# Patient Record
Sex: Female | Born: 1960 | Race: White | Hispanic: No | Marital: Married | Smoking: Never smoker
Health system: Southern US, Community
[De-identification: ages and names within clinical notes are randomized; demographics above are authoritative.]

## PROBLEM LIST (undated history)

## (undated) DIAGNOSIS — M797 Fibromyalgia: Secondary | ICD-10-CM

## (undated) DIAGNOSIS — Z91018 Allergy to other foods: Secondary | ICD-10-CM

## (undated) DIAGNOSIS — R7989 Other specified abnormal findings of blood chemistry: Secondary | ICD-10-CM

## (undated) DIAGNOSIS — Z91030 Bee allergy status: Secondary | ICD-10-CM

## (undated) DIAGNOSIS — Z91038 Other insect allergy status: Secondary | ICD-10-CM

## (undated) DIAGNOSIS — K900 Celiac disease: Secondary | ICD-10-CM

## (undated) HISTORY — DX: Allergy to other foods: Z91.018

## (undated) HISTORY — DX: Celiac disease: K90.0

## (undated) HISTORY — DX: Fibromyalgia: M79.7

## (undated) HISTORY — DX: Other insect allergy status: Z91.038

## (undated) HISTORY — DX: Other specified abnormal findings of blood chemistry: R79.89

---
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
## 1998-08-12 ENCOUNTER — Other Ambulatory Visit: Admission: RE | Admit: 1998-08-12 | Discharge: 1998-08-12 | Payer: Self-pay | Admitting: Obstetrics and Gynecology

## 1999-08-30 ENCOUNTER — Other Ambulatory Visit: Admission: RE | Admit: 1999-08-30 | Discharge: 1999-08-30 | Payer: Self-pay | Admitting: Family Medicine

## 2000-09-04 ENCOUNTER — Other Ambulatory Visit: Admission: RE | Admit: 2000-09-04 | Discharge: 2000-09-04 | Payer: Self-pay | Admitting: Obstetrics and Gynecology

## 2001-12-11 ENCOUNTER — Other Ambulatory Visit: Admission: RE | Admit: 2001-12-11 | Discharge: 2001-12-11 | Payer: Self-pay | Admitting: Obstetrics and Gynecology

## 2003-03-03 ENCOUNTER — Other Ambulatory Visit: Admission: RE | Admit: 2003-03-03 | Discharge: 2003-03-03 | Payer: Self-pay | Admitting: Obstetrics and Gynecology

## 2004-03-09 ENCOUNTER — Other Ambulatory Visit: Admission: RE | Admit: 2004-03-09 | Discharge: 2004-03-09 | Payer: Self-pay | Admitting: Obstetrics and Gynecology

## 2005-05-26 ENCOUNTER — Other Ambulatory Visit: Admission: RE | Admit: 2005-05-26 | Discharge: 2005-05-26 | Payer: Self-pay | Admitting: Obstetrics and Gynecology

## 2005-07-10 HISTORY — PX: CHOLECYSTECTOMY: SHX55

## 2017-03-29 ENCOUNTER — Encounter: Payer: Self-pay | Admitting: Allergy and Immunology

## 2017-03-29 ENCOUNTER — Ambulatory Visit (INDEPENDENT_AMBULATORY_CARE_PROVIDER_SITE_OTHER): Payer: BLUE CROSS/BLUE SHIELD | Admitting: Allergy and Immunology

## 2017-03-29 VITALS — BP 104/72 | HR 76 | Temp 98.2°F | Resp 16 | Ht 65.7 in | Wt 134.7 lb

## 2017-03-29 DIAGNOSIS — T781XXA Other adverse food reactions, not elsewhere classified, initial encounter: Secondary | ICD-10-CM | POA: Diagnosis not present

## 2017-03-29 DIAGNOSIS — IMO0001 Reserved for inherently not codable concepts without codable children: Secondary | ICD-10-CM

## 2017-03-29 DIAGNOSIS — J3089 Other allergic rhinitis: Secondary | ICD-10-CM | POA: Diagnosis not present

## 2017-03-29 DIAGNOSIS — T782XXA Anaphylactic shock, unspecified, initial encounter: Secondary | ICD-10-CM | POA: Diagnosis not present

## 2017-03-29 DIAGNOSIS — T63441A Toxic effect of venom of bees, accidental (unintentional), initial encounter: Secondary | ICD-10-CM | POA: Diagnosis not present

## 2017-03-29 MED ORDER — AUVI-Q 0.3 MG/0.3ML IJ SOAJ
INTRAMUSCULAR | 3 refills | Status: DC
Start: 2017-03-29 — End: 2018-04-25

## 2017-03-29 NOTE — Progress Notes (Signed)
Dear Dr. Unk Lightning,  Thank you for referring [name] to the [facility] on 03/29/2017.   Below is a summation of this patient's evaluation and recommendations.  Thank you for your referral. I will keep you informed about this patient's response to treatment.   If you have any questions please do not hesitate to contact me.   Sincerely,  Jiles Prows, MD Allergy / Immunology [facility]   ______________________________________________________________________    NEW PATIENT NOTE  Referring Provider: Myrlene Broker, MD Primary Provider: Myrlene Broker, MD Date of office visit: 03/29/2017    Subjective:   Chief Complaint:  Heather Barry (DOB: [birth year]) is a 56 y.o. female who presents to the clinic on 03/29/2017 with a chief complaint of Allergic Reaction .     HPI: [name] presents to this clinic in evaluation of 2 allergic reactions that occurred since the summer of 2018.  Apparently in July 2018 she was stung by some un-identified insect on her left thigh for which she applied a Benadryl cream only to have this reaction develop a large local reaction that went down past her knee along with shortness of breath for which she was evaluated by her primary care doctor. Apparently blood tests at that point in time identified peanut and shrimp allergy and she has been peanut and shrimp free since that event although it should be noted that she ate peanut and shrimp the week or 2 before with no problem. 3 weeks ago she was stung on the left leg and had a small whelp, and immediately took oral Benadryl and developed a very large local reaction within 1 hour without any associated systemic or constitutional symptoms.  In addition to this most recent allergic reaction she also has a history of having an allergy to [place] nut and walnuts and almonds. Apparently all of these nuts at the  age of 47 gave rise to throat swelling. She has been [place] nut and walnut and almond free since.  She does have a history of sneezing and nasal congestion and an itchy throat occurring on a perennial basis with flare during the spring and fall especially following exposure to grasses and cats. Apparently she gets treated with a Kenalog injection one or 2 times per year for this issue. In addition, when she eats melon or bananas she develops a very itchy throat.  Past Medical History:  Diagnosis Date  . Fibromyalgia   . Low vitamin D level     Past Surgical History:  Procedure Laterality Date  . CESAREAN SECTION  11/1996  . CHOLECYSTECTOMY  2007    Allergies as of 03/29/2017      Reactions   Cefdinir Other (See Comments)   Stomach pain   Clarithromycin Other (See Comments)   Stomach pain   Zoloft  [sertraline Hcl] Itching      Medication List      BENEFIBER PO Take by mouth daily.   clonazePAM 0.25 MG disintegrating tablet Commonly known as:  KLONOPIN DISSOLVE ONE TABLET BY MOUTH EVERY TWELVE HOURS AS NEEDED   EPIPEN 2-PAK 0.3 mg/0.3 mL Soaj injection Generic drug:  EPINEPHrine USE as needed FOR SEVERE allergic reaction   PRESCRIPTION MEDICATION 2 (two) times daily. BioIdentical Hormone   ranitidine 150 MG tablet Commonly known as:  ZANTAC Take 150 mg by mouth 2 (two) times daily.   Vitamin D3 50000 units Caps Take 1  capsule by mouth once a week.       Review of systems negative except as noted in HPI / PMHx or noted below:  Review of Systems  Constitutional: Negative.   HENT: Negative.   Eyes: Negative.   Respiratory: Negative.   Cardiovascular: Negative.   Gastrointestinal: Negative.   Genitourinary: Negative.   Musculoskeletal: Negative.   Skin: Negative.   Neurological: Negative.   Endo/Heme/Allergies: Negative.   Psychiatric/Behavioral: Negative.     Family History  Problem Relation Age of Onset  . Stroke Mother   . High blood pressure Mother    . High Cholesterol Mother   . Thyroid disease Father   . Stroke Father   . High Cholesterol Father   . Multiple sclerosis Sister   . Food Allergy Sister        Peanut Allergy    Social History   Social History  . Marital status: Married    Spouse name: N/A  . Number of children: N/A  . Years of education: N/A   Occupational History  . Not on file.   Social History Main Topics  . Smoking status: Never Smoker  . Smokeless tobacco: Never Used  . Alcohol use No  . Drug use: No  . Sexual activity: Not on file   Other Topics Concern  . Not on file   Social History Narrative  . No narrative on file    Environmental and Social history  Lives in a house with a dry environment, a dog located inside the household, carpeting the bedroom, plastic on the bed, no plastic on the pillow, and no smokers located inside the household.  Objective:   Vitals:   03/29/17 1022  BP: 104/72  Pulse: 76  Resp: 16  Temp: 98.2 F (36.8 C)   Height: 5' 5.7" (166.9 cm) Weight: 134 lb 11.2 oz (61.1 kg)  Physical Exam  Constitutional: She is well-developed, well-nourished, and in no distress.  HENT:  Head: Normocephalic. Head is without right periorbital erythema and without left periorbital erythema.  Right Ear: Tympanic membrane, external ear and ear canal normal.  Left Ear: Tympanic membrane, external ear and ear canal normal.  Nose: Nose normal. No mucosal edema or rhinorrhea.  Mouth/Throat: Uvula is midline, oropharynx is clear and moist and mucous membranes are normal. No oropharyngeal exudate.  Eyes: Pupils are equal, round, and reactive to light. Conjunctivae and lids are normal.  Neck: Trachea normal. No tracheal tenderness present. No tracheal deviation present. No thyromegaly present.  Cardiovascular: Normal rate, regular rhythm, S1 normal, S2 normal and normal heart sounds.   No murmur heard. Pulmonary/Chest: Effort normal and breath sounds normal. No stridor. No tachypnea.  No respiratory distress. She has no wheezes. She has no rales. She exhibits no tenderness.  Abdominal: Soft. She exhibits no distension and no mass. There is no hepatosplenomegaly. There is no tenderness. There is no rebound and no guarding.  Musculoskeletal: She exhibits no edema or tenderness.  Lymphadenopathy:       Head (right side): No tonsillar adenopathy present.       Head (left side): No tonsillar adenopathy present.    She has no cervical adenopathy.    She has no axillary adenopathy.  Neurological: She is alert. Gait normal.  Skin: No rash noted. She is not diaphoretic. No erythema. No pallor. Nails show no clubbing.  Psychiatric: Mood and affect normal.    Diagnostics: Allergy skin tests were performed. She did not demonstrate any hypersensitivity to a screening  panel of foods. She did demonstrate hypersensitivity against fire ant.  Results of blood tests obtained 01/11/2017 identified a peanut IgE at 0.14 KU/ML and shrimp at 0.21 KU/ML. She had multiple increased titers of IgE antibodies directed against house dust mite, cat, grasses, trees, and weeds with a total IgE level of 111 KU/L  Assessment and Plan:    1. Anaphylaxis, initial encounter   2. Hymenoptera reaction, accidental or unintentional, initial encounter   3. Other allergic rhinitis   4. Pollen-food allergy, initial encounter     1. Allergen avoidance measures  2. Auvi-Q 0.3, Benadryl, M.D./ER evaluation for allergic reaction  3. Blood - fire ant IgE, Hymenoptera venom panel, shellfish panel, nut panel  4. Can use OTC Nasacort/Rhinocort one spray each nostril one time per day to prevent upper airway allergic symptoms  5. Can use OTC Claritin/Zyrtec/Allegra one time per day as needed  6. Obtain fall flu vaccine  7. Further evaluation treatment? Yes if recurrent.   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 She will follow-up this atopic analysis by checking for antigen specific antibodies directed against fire ant as well as other Hymenoptera as well as further defining her sensitivity to all shellfish members and nut members. In addition, she can use nasal steroids and antihistamines for her upper airway atopic disease. I will contact her with the results of her blood tests once they are available for review. If they do confirm fire ant allergy we will be starting her on a course of fire ant immunotherapy and we will make a determination about further evaluation for shellfish and nut allergy pending the results of those tests.  Jiles Prows, MD Allergy / Immunology [facility]

## 2017-03-29 NOTE — Patient Instructions (Addendum)
  1. Allergen avoidance measures  2. Auvi-Q 0.3, Benadryl, M.D./ER evaluation for allergic reaction  3. Blood - fire ant IgE, Hymenoptera venom panel, shellfish panel, nut panel  4. Can use OTC Nasacort/Rhinocort one spray each nostril one time per day to prevent upper airway allergic symptoms  5. Can use OTC Claritin/Zyrtec/Allegra one time per day as needed  6. Obtain fall flu vaccine  7. Further evaluation treatment? Yes if recurrent.

## 2017-04-02 LAB — ALLERGEN FIRE ANT: I070-IgE Fire Ant (Invicta): 0.59 kU/L — AB

## 2017-04-02 LAB — ALLERGENS(7)
F020-IgE Almond: <0.1 kU/L
F202-IgE Cashew Nut: <0.1 kU/L
Hazelnut (Filbert) IgE: <0.1 kU/L
Walnut IgE: <0.1 kU/L

## 2017-04-02 LAB — ALLERGEN PROFILE, SHELLFISH
F023-IgE Crab: 0.12 kU/L — AB
F080-IgE Lobster: <0.1 kU/L
F290-IgE Oyster: <0.1 kU/L
Shrimp IgE: 0.12 kU/L — AB

## 2017-04-02 LAB — ALLERGEN HYMENOPTERA PANEL
Honeybee IgE: <0.1 kU/L
Hornet, White Face, IgE: 1.16 kU/L — AB
Hornet, Yellow, IgE: 0.23 kU/L — AB
I003-IGE YELLOW JACKET: 3.43 kU/L — AB
Paper Wasp IgE: 3.4 kU/L — AB

## 2017-04-09 ENCOUNTER — Ambulatory Visit: Payer: BLUE CROSS/BLUE SHIELD | Admitting: Allergy and Immunology

## 2017-04-09 VITALS — BP 110/60 | HR 80 | Resp 14 | Ht 66.0 in | Wt 141.0 lb

## 2017-04-09 DIAGNOSIS — T63441D Toxic effect of venom of bees, accidental (unintentional), subsequent encounter: Secondary | ICD-10-CM

## 2017-04-09 DIAGNOSIS — IMO0001 Reserved for inherently not codable concepts without codable children: Secondary | ICD-10-CM

## 2017-04-09 NOTE — Progress Notes (Signed)
Heather Barry presents to this clinic to discuss her blood test results which identified IgE antibodies directed against shellfish members including shrimp and crab and IgE antibodies directed against mixed vespid including hornet and yellow jacket and wasp and fire ant. She will be starting immunotherapy against mixed vespid and wasp and fire ant. I will see her back in this clinic in 6 months. She has in her possession a epinephrine autoinjector device.

## 2017-04-10 ENCOUNTER — Encounter: Payer: Self-pay | Admitting: Allergy and Immunology

## 2017-04-19 ENCOUNTER — Other Ambulatory Visit: Payer: Self-pay | Admitting: Allergy and Immunology

## 2017-04-19 DIAGNOSIS — T63481D Toxic effect of venom of other arthropod, accidental (unintentional), subsequent encounter: Secondary | ICD-10-CM

## 2017-04-19 DIAGNOSIS — T782XXD Anaphylactic shock, unspecified, subsequent encounter: Principal | ICD-10-CM

## 2017-04-19 DIAGNOSIS — T63421D Toxic effect of venom of ants, accidental (unintentional), subsequent encounter: Secondary | ICD-10-CM

## 2017-04-19 NOTE — Progress Notes (Signed)
VIALS EXP 04-19-18

## 2017-05-03 ENCOUNTER — Ambulatory Visit (INDEPENDENT_AMBULATORY_CARE_PROVIDER_SITE_OTHER): Payer: BLUE CROSS/BLUE SHIELD | Admitting: *Deleted

## 2017-05-03 DIAGNOSIS — J309 Allergic rhinitis, unspecified: Secondary | ICD-10-CM

## 2017-05-03 NOTE — Progress Notes (Signed)
Patient started injections fire ant 1/100,000 at 0.05 and mixed vespid .003 and wasp .001 at 0.05 dosage. Reviewed side effects injection schedules and how when to use epipen. Patient already has epipen.  Patient waited 24mnutes with no problems

## 2017-05-10 ENCOUNTER — Ambulatory Visit (INDEPENDENT_AMBULATORY_CARE_PROVIDER_SITE_OTHER): Payer: BLUE CROSS/BLUE SHIELD | Admitting: *Deleted

## 2017-05-10 DIAGNOSIS — T63441D Toxic effect of venom of bees, accidental (unintentional), subsequent encounter: Secondary | ICD-10-CM | POA: Diagnosis not present

## 2017-05-10 DIAGNOSIS — IMO0001 Reserved for inherently not codable concepts without codable children: Secondary | ICD-10-CM

## 2017-05-17 ENCOUNTER — Ambulatory Visit (INDEPENDENT_AMBULATORY_CARE_PROVIDER_SITE_OTHER): Payer: BLUE CROSS/BLUE SHIELD | Admitting: *Deleted

## 2017-05-17 DIAGNOSIS — IMO0001 Reserved for inherently not codable concepts without codable children: Secondary | ICD-10-CM

## 2017-05-17 DIAGNOSIS — T63441D Toxic effect of venom of bees, accidental (unintentional), subsequent encounter: Secondary | ICD-10-CM

## 2017-05-24 ENCOUNTER — Ambulatory Visit (INDEPENDENT_AMBULATORY_CARE_PROVIDER_SITE_OTHER): Payer: BLUE CROSS/BLUE SHIELD | Admitting: *Deleted

## 2017-05-24 DIAGNOSIS — T63441D Toxic effect of venom of bees, accidental (unintentional), subsequent encounter: Secondary | ICD-10-CM

## 2017-05-24 DIAGNOSIS — IMO0001 Reserved for inherently not codable concepts without codable children: Secondary | ICD-10-CM

## 2017-05-28 ENCOUNTER — Ambulatory Visit (INDEPENDENT_AMBULATORY_CARE_PROVIDER_SITE_OTHER): Payer: BLUE CROSS/BLUE SHIELD

## 2017-05-28 DIAGNOSIS — T63441D Toxic effect of venom of bees, accidental (unintentional), subsequent encounter: Secondary | ICD-10-CM

## 2017-05-28 DIAGNOSIS — IMO0001 Reserved for inherently not codable concepts without codable children: Secondary | ICD-10-CM

## 2017-06-11 ENCOUNTER — Ambulatory Visit (INDEPENDENT_AMBULATORY_CARE_PROVIDER_SITE_OTHER): Payer: BLUE CROSS/BLUE SHIELD | Admitting: *Deleted

## 2017-06-11 DIAGNOSIS — T63441D Toxic effect of venom of bees, accidental (unintentional), subsequent encounter: Secondary | ICD-10-CM | POA: Diagnosis not present

## 2017-06-11 DIAGNOSIS — IMO0001 Reserved for inherently not codable concepts without codable children: Secondary | ICD-10-CM

## 2017-06-25 ENCOUNTER — Ambulatory Visit (INDEPENDENT_AMBULATORY_CARE_PROVIDER_SITE_OTHER): Payer: BLUE CROSS/BLUE SHIELD | Admitting: *Deleted

## 2017-06-25 DIAGNOSIS — IMO0001 Reserved for inherently not codable concepts without codable children: Secondary | ICD-10-CM

## 2017-06-25 DIAGNOSIS — T63441D Toxic effect of venom of bees, accidental (unintentional), subsequent encounter: Secondary | ICD-10-CM

## 2017-07-05 ENCOUNTER — Ambulatory Visit (INDEPENDENT_AMBULATORY_CARE_PROVIDER_SITE_OTHER): Payer: BLUE CROSS/BLUE SHIELD | Admitting: *Deleted

## 2017-07-05 DIAGNOSIS — T63441D Toxic effect of venom of bees, accidental (unintentional), subsequent encounter: Secondary | ICD-10-CM

## 2017-07-05 DIAGNOSIS — IMO0001 Reserved for inherently not codable concepts without codable children: Secondary | ICD-10-CM

## 2017-07-12 ENCOUNTER — Encounter: Payer: Self-pay | Admitting: Allergy

## 2017-07-12 ENCOUNTER — Ambulatory Visit (INDEPENDENT_AMBULATORY_CARE_PROVIDER_SITE_OTHER): Payer: BLUE CROSS/BLUE SHIELD | Admitting: Allergy

## 2017-07-12 VITALS — BP 122/76 | HR 92 | Resp 18

## 2017-07-12 DIAGNOSIS — T63441D Toxic effect of venom of bees, accidental (unintentional), subsequent encounter: Secondary | ICD-10-CM

## 2017-07-12 DIAGNOSIS — IMO0001 Reserved for inherently not codable concepts without codable children: Secondary | ICD-10-CM

## 2017-07-12 DIAGNOSIS — J3089 Other allergic rhinitis: Secondary | ICD-10-CM | POA: Diagnosis not present

## 2017-07-12 DIAGNOSIS — R05 Cough: Secondary | ICD-10-CM

## 2017-07-12 DIAGNOSIS — R059 Cough, unspecified: Secondary | ICD-10-CM

## 2017-07-12 NOTE — Patient Instructions (Addendum)
  1. Allergen avoidance measures  2. Auvi-Q 0.3, Benadryl, M.D./ER evaluation for allergic reaction  3. Continue venom injections at this time  4. Start Dymista 1 spray each nostril twice a day.  This is a combination nasal spray with Flonase + Astelin (nasal antihistamine).  This helps with both nasal congestion and drainage.   5. Start Zyrtec 14m daily  6.  Use saline rinse kit prior to nasal spray use.  Use distilled water or boil water and bring to room temperature (do not use tap water).    7. Take nasal spray and zyrtec above consistently over the next 1-2 weeks and let [place] if this helps to decrease/prevent cough you are having.  If so then continue these medications.  If not will have you try albuterol inhaler as needed.    Call office in 1-2 weeks with update on symptoms  Routine follow-up in 6 months

## 2017-07-12 NOTE — Progress Notes (Signed)
  Follow-up Note  RE: Heather Barry MRN: [id number] DOB: 04/05/1961 Date of Office Visit: 07/12/2017   History of present illness: Heather Barry is a 56 y.o. female presenting today for sick visit.  She was last seen in the office on March 29, 2017 by Dr. Koslow.  Since this visit she was started on venom immunotherapy.  After starting on immunotherapy she reports she developed a dry cough.  The immunotherapy is the only new change that she has had.  She states she has a history of reflux but she has not noted any worsening of symptoms and remains on Zantac that she was on prior to starting on venom immunotherapy. She states the cough occurs all throughout the day.  She states if she gets her shot on Thursday that the weekend the cough will be worse.  She also notes that the cough seems to be better immediately prior to her normal shot day.  She states she normally has nasal drainage and congestion even before she started on immunotherapy.  She denies any fevers.  She states she uses Nasacort and Zyrtec only as needed.  She states she really has not been using it since the cough started.  She has no history of asthma or need for any breathing treatments.  Review of systems: Review of Systems  Constitutional: Negative for chills, fever and malaise/fatigue.  HENT: Positive for congestion. Negative for ear discharge, ear pain, nosebleeds, sinus pain, sore throat and tinnitus.   Eyes: Negative for pain, discharge and redness.  Respiratory: Positive for cough. Negative for sputum production, shortness of breath and wheezing.   Cardiovascular: Negative for chest pain.  Gastrointestinal: Negative for abdominal pain, constipation, diarrhea, heartburn, nausea and vomiting.  Musculoskeletal: Negative for joint pain and myalgias.  Skin: Negative for itching and rash.  Neurological: Negative for headaches.    All other systems negative unless noted above in HPI  Past  medical/social/surgical/family history have been reviewed and are unchanged unless specifically indicated below.  No changes  Medication List: Allergies as of 07/12/2017      Reactions   Cefdinir Other (See Comments)   Stomach pain   Clarithromycin Other (See Comments)   Stomach pain   Zoloft  [sertraline Hcl] Itching      Medication List        Accurate as of 07/12/17  5:33 PM. Always use your most recent med list.          cetirizine 10 MG tablet Commonly known as:  ZYRTEC Take 10 mg by mouth daily.   clonazePAM 0.25 MG disintegrating tablet Commonly known as:  KLONOPIN DISSOLVE ONE TABLET BY MOUTH EVERY TWELVE HOURS AS NEEDED   EPIPEN 2-PAK 0.3 mg/0.3 mL Soaj injection Generic drug:  EPINEPHrine USE as needed FOR SEVERE allergic reaction   AUVI-Q 0.3 mg/0.3 mL Soaj injection Generic drug:  EPINEPHrine Use as directed for life-threatening allergic reaction.   PRESCRIPTION MEDICATION 2 (two) times daily. BioIdentical Hormone   ranitidine 150 MG tablet Commonly known as:  ZANTAC Take 150 mg by mouth 2 (two) times daily.       Known medication allergies: Allergies  Allergen Reactions  . Cefdinir Other (See Comments)    Stomach pain  . Clarithromycin Other (See Comments)    Stomach pain  . Zoloft  [Sertraline Hcl] Itching     Physical examination: Blood pressure 122/76, pulse 92, resp. rate 18.  General: Alert, interactive, in no acute distress. HEENT: PERRLA, TMs pearly gray, turbinates mildly edematous   with clear discharge, post-pharynx non erythematous. Neck: Supple without lymphadenopathy. Lungs: Clear to auscultation without wheezing, rhonchi or rales. {no increased work of breathing. CV: Normal S1, S2 without murmurs. Abdomen: Nondistended, nontender. Skin: Warm and dry, without lesions or rashes. Extremities:  No clubbing, cyanosis or edema. Neuro:   Grossly intact.  Diagnositics/Labs:  Spirometry: FEV1: 2.11L  75%, FVC: 2.6L  73% this is  patient's first attempt at spirometry and is largely unremarkable (FEV1 and FVC are only slightly under normal)   Assessment and plan:   Allergic rhinitis Cough - I am hopeful that her cough may be related to postnasal drainage that may be triggered by immunotherapy.  We will have her take consistently her antihistamine as well as use a nasal antihistamine plus nasal steroid combination.   She will let us know how this is working for her and decrease in the cough.  I do not think the cough is related to reflux as she has been on Zantac with good control prior to even starting on immunotherapy.  She has no history of asthma or obstructive diseases however she may warrant a trial of albuterol to see if this helps decrease the cough.  Last effort would be to discontinue the immunotherapy. 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 Allergen avoidance measures  2. Auvi-Q 0.3, Benadryl, M.D./ER evaluation for allergic reaction  3. Continue venom injections at this time  4. Start Dymista 1 spray each nostril twice a day.  This is a combination nasal spray with Flonase + Astelin (nasal antihistamine).  This helps with both nasal congestion and drainage.   5. Start Zyrtec 10mg daily  6.  Use saline rinse kit prior to nasal spray use.  Use distilled water or boil water and bring to room temperature (do not use tap water).    7. Take nasal spray and zyrtec above consistently over the next 1-2 weeks and let us know if this helps to decrease/prevent cough you are having.  If so then continue these medications.  If not will have you try albuterol inhaler as needed.    Call office in 1-2 weeks with update on symptoms  Routine follow-up in 6 months  I appreciate the opportunity to take part in Heather Barry's care. Please do not hesitate to contact me with questions.  Sincerely,   Shaylar Padgett, MD Allergy/Immunology [facility]   

## 2017-07-19 ENCOUNTER — Ambulatory Visit (INDEPENDENT_AMBULATORY_CARE_PROVIDER_SITE_OTHER): Payer: BLUE CROSS/BLUE SHIELD | Admitting: *Deleted

## 2017-07-19 DIAGNOSIS — T63441D Toxic effect of venom of bees, accidental (unintentional), subsequent encounter: Secondary | ICD-10-CM | POA: Diagnosis not present

## 2017-07-19 DIAGNOSIS — IMO0001 Reserved for inherently not codable concepts without codable children: Secondary | ICD-10-CM

## 2017-07-26 ENCOUNTER — Telehealth: Payer: Self-pay | Admitting: Allergy

## 2017-07-26 NOTE — Telephone Encounter (Signed)
Heather Barry would like a call back to discuss some issues she is having with the current medications she is taking.  Dr. Nelva Bush asked Rylyn to  Call and let us know how she has been doing on this medication.

## 2017-07-26 NOTE — Telephone Encounter (Signed)
Heather Barry tried the [employer] and Zyrtec daily.  At first, the [name] did help with cough but it has started to return like before.  She would like to stop her venom shots for a period of time to see if this improves her cough.  She is not interested in starting inhaler at this time. She will let us know how she does after stopping the venom injections.

## 2017-07-26 NOTE — Telephone Encounter (Signed)
Ok thanks.  Yes a trial off venom injections would absolutely show if it is triggering this cough.   Would recommend she stay off for at least the next 4 weeks at minimum

## 2017-07-26 NOTE — Telephone Encounter (Signed)
Patient informed. 

## 2017-08-06 ENCOUNTER — Telehealth: Payer: Self-pay | Admitting: Allergy and Immunology

## 2017-08-06 NOTE — Telephone Encounter (Signed)
Talked to Alayjah - told her was refiled her claim with the correct ID# on 07-27-17 - kt

## 2017-08-06 NOTE — Telephone Encounter (Signed)
Calleigh's husband came in with a bill from [facility] for 2144.09.  It was filed under the old insurance.  He would like the January 3rd claim to be filed under [employer] that is currently on file for [place].  Please call him back as he would like to speak to someone to make sure this is taken care of.  Thank you.

## 2017-08-22 NOTE — Telephone Encounter (Signed)
FYI: Heather Barry states that it has been 4 weeks since she stopped shots and she has had no more cough so she is not interested in re-starting her shots.

## 2017-08-22 NOTE — Telephone Encounter (Signed)
Ok thanks for letting me know.   She should have access to her epinephrine device at all times especially during warmer seasons with stinging insects are more prevalent.

## 2017-08-22 NOTE — Telephone Encounter (Signed)
Patient has an Auvi-Q device and understands how to use.

## 2017-09-04 ENCOUNTER — Other Ambulatory Visit: Payer: Self-pay | Admitting: Urology

## 2017-09-04 DIAGNOSIS — R311 Benign essential microscopic hematuria: Secondary | ICD-10-CM

## 2017-09-04 DIAGNOSIS — R100 Acute abdomen: Secondary | ICD-10-CM

## 2017-09-06 ENCOUNTER — Ambulatory Visit
Admission: RE | Admit: 2017-09-06 | Discharge: 2017-09-06 | Disposition: A | Discharge status: Home / Self Care | Payer: BLUE CROSS/BLUE SHIELD | Source: Ambulatory Visit | Attending: Urology | Admitting: Urology

## 2017-09-06 DIAGNOSIS — R100 Acute abdomen: Secondary | ICD-10-CM

## 2017-09-06 DIAGNOSIS — R311 Benign essential microscopic hematuria: Secondary | ICD-10-CM

## 2017-09-17 ENCOUNTER — Other Ambulatory Visit: Payer: Self-pay

## 2017-10-08 ENCOUNTER — Telehealth: Payer: Self-pay | Admitting: Allergy and Immunology

## 2017-10-08 NOTE — Telephone Encounter (Signed)
Faxed printout to [place] - kt

## 2017-10-08 NOTE — Telephone Encounter (Signed)
Merry Proud, Kylieann's husband, would like a print out (for tax purposes) of everything they have paid out in 2018.  Merry Proud would like it faxed to him at [phone] Attn: Merry Proud and he would like it today if possible but by tomorrow the latest.

## 2017-11-12 ENCOUNTER — Telehealth: Payer: Self-pay | Admitting: *Deleted

## 2017-11-12 NOTE — Telephone Encounter (Signed)
Referral fax from [facility] [place] sent to scheduling [phone]  [phone]

## 2017-12-31 ENCOUNTER — Encounter

## 2017-12-31 ENCOUNTER — Ambulatory Visit: Payer: BLUE CROSS/BLUE SHIELD | Admitting: Cardiology

## 2018-01-04 ENCOUNTER — Institutional Professional Consult (permissible substitution): Payer: BLUE CROSS/BLUE SHIELD | Admitting: Internal Medicine

## 2018-03-01 ENCOUNTER — Institutional Professional Consult (permissible substitution): Payer: BLUE CROSS/BLUE SHIELD | Admitting: Internal Medicine

## 2018-03-11 IMAGING — CT CT ABD-PELV W/O CM
2 of 4 series · 11 of 46 positions shown, 12 images · non-contrast
Comparison: 09/24/2015

CLINICAL DATA: Right flank pain and nausea for 6 days. Microscopic
hematuria. Nephrolithiasis.

EXAM:
CT ABDOMEN AND PELVIS WITHOUT CONTRAST
TECHNIQUE: Multidetector CT imaging of the abdomen and pelvis was performed
following the standard protocol without IV contrast.

[Series 2: renal stone 5.00 br40 s3 ax · axial · 0.44mm/px · z∈[+1021,+1386]mm · 8 of 91 slices shown, 9 images]
[im 9/91  soft-tissue]
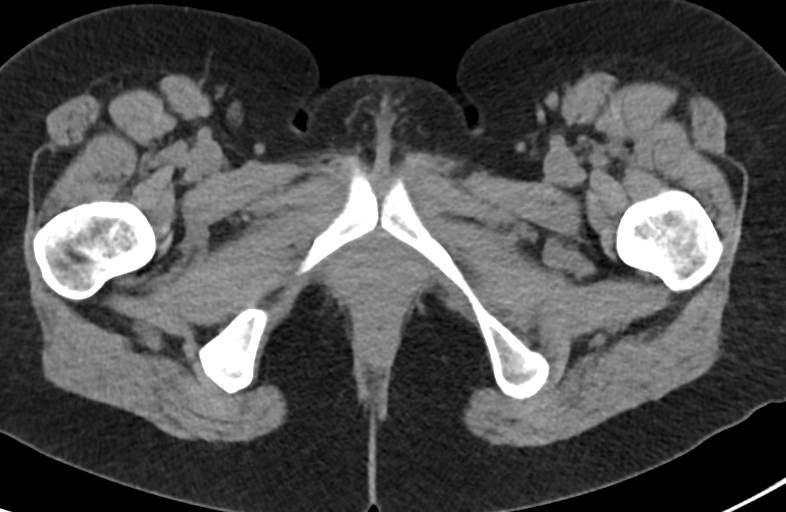
[im 9/91  bone]
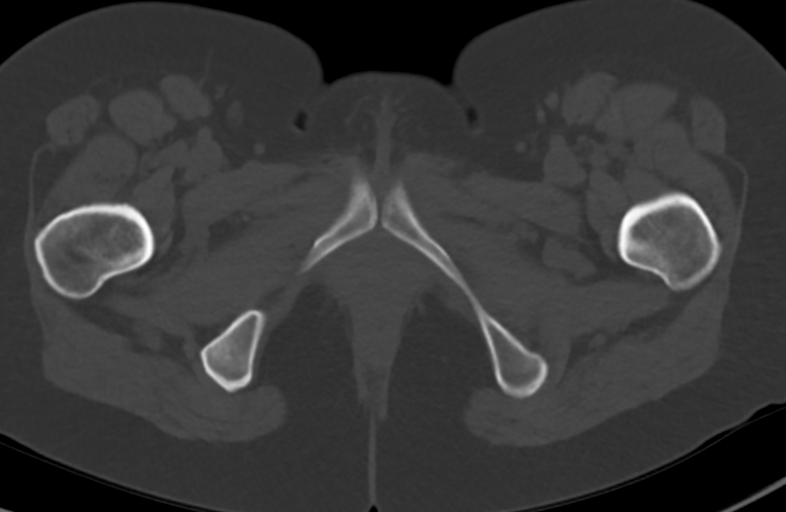
[im 18/91  soft-tissue]
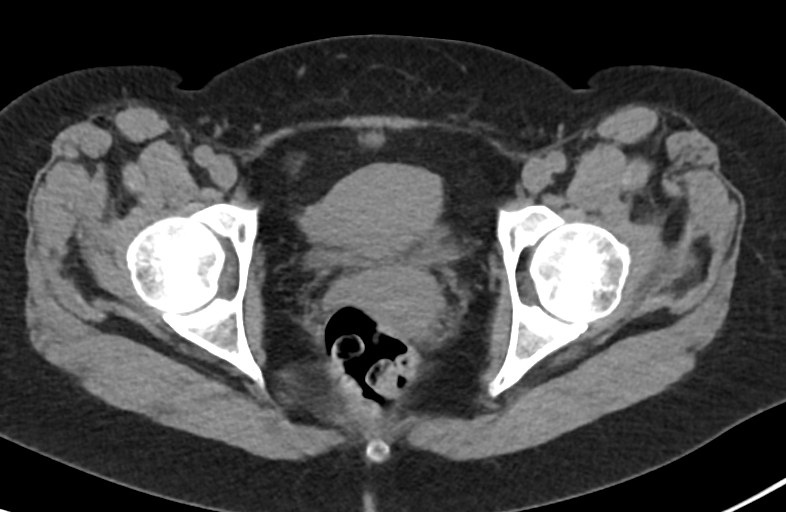
[im 31/91  soft-tissue]
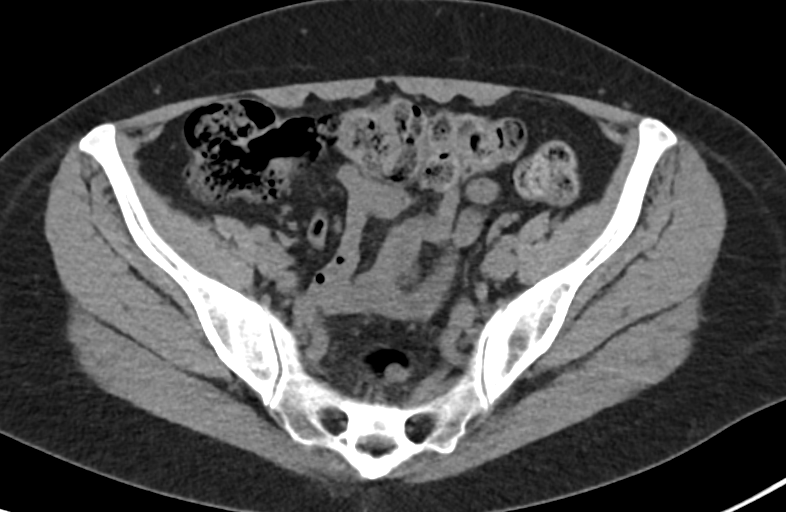
[im 39/91  soft-tissue]
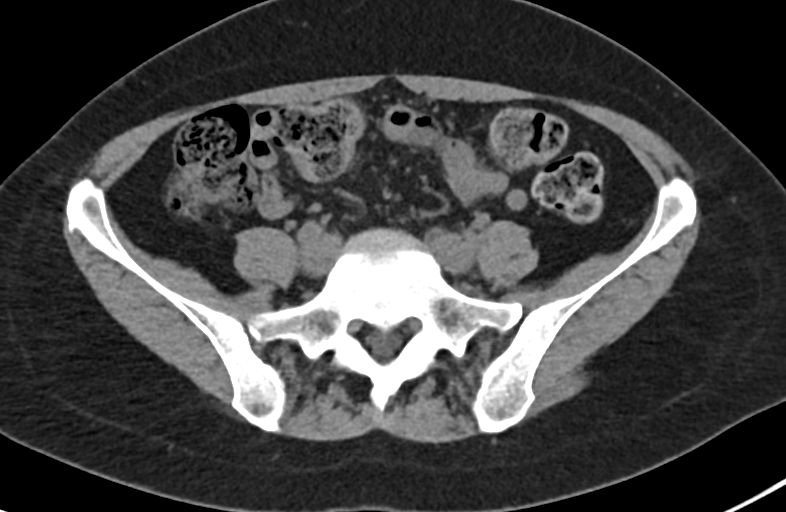
[im 52/91  soft-tissue]
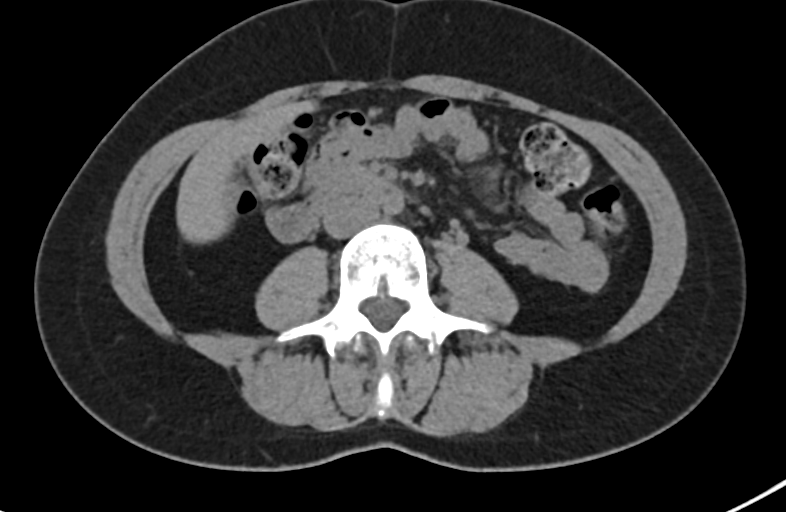
[im 61/91  soft-tissue]
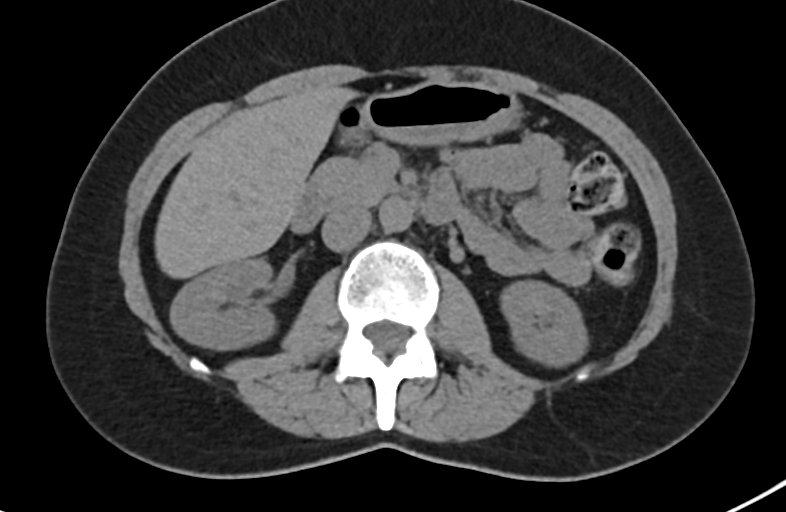
[im 73/91  soft-tissue]
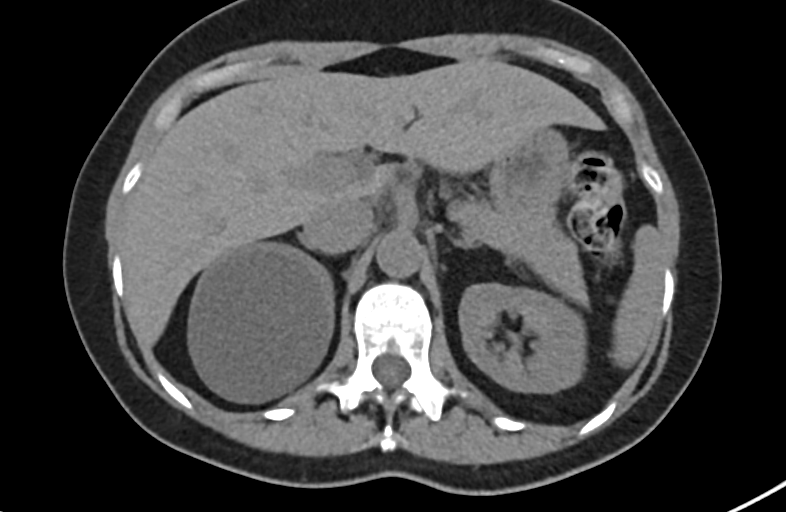
[im 82/91  soft-tissue]
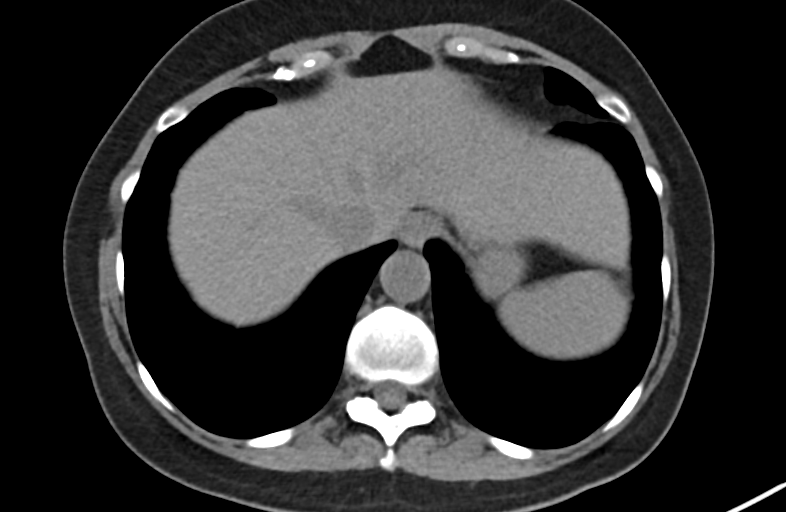

[Series 6: renal stone 2.00 br40 s3 cor · coronal · 0.67mm/px · 3 of 111 slices shown]
[im 37/111  soft-tissue]
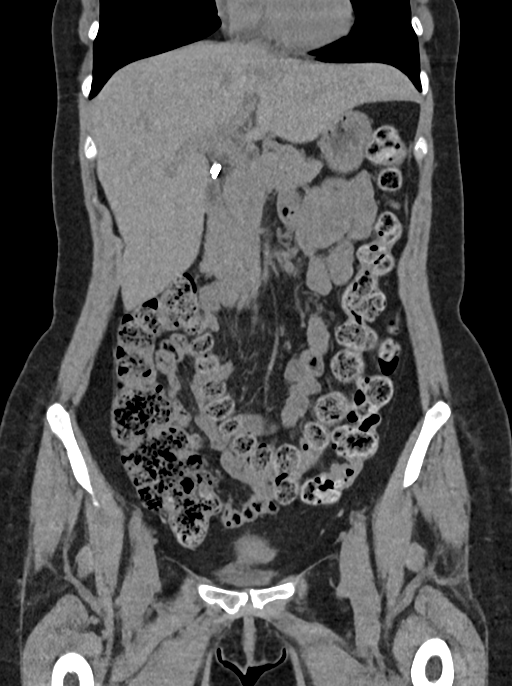
[im 49/111  soft-tissue]
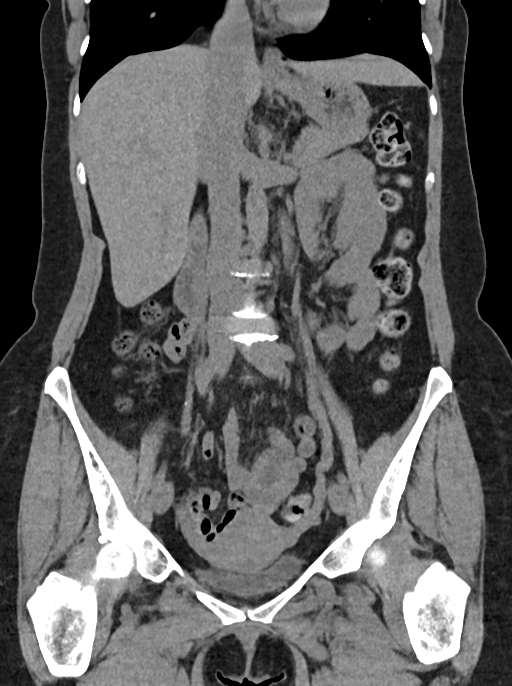
[im 62/111  soft-tissue]
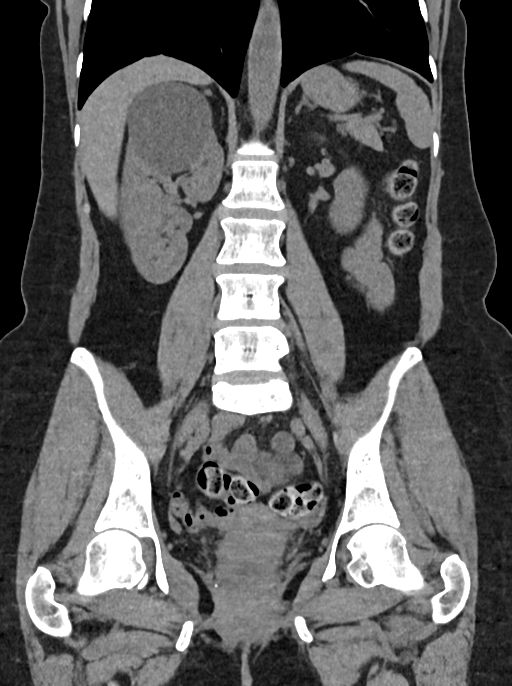

[11 of 46 positions shown; findings below may reference images not displayed]

FINDINGS: Lower chest: No acute findings.

Hepatobiliary: 1.9 cm low-attenuation lesion in the posterior right
hepatic lobe is stable since previous study, and consistent with
benign etiology. Tiny sub-cm cyst in the superior right lobe is also
unchanged. Prior cholecystectomy. No evidence of biliary
obstruction.

Pancreas: No mass or inflammatory process visualized on this
unenhanced exam.

Spleen:  Within normal limits in size.

Adrenals/Urinary tract: 7 cm cyst in upper pole of right kidney is
stable. Small renal calculi are seen bilaterally, largest in lower
pole of right kidney measuring 4 mm. No evidence of ureteral calculi
or hydronephrosis. Unremarkable unopacified urinary bladder.

Stomach/Bowel: No evidence of obstruction, inflammatory process, or
abnormal fluid collections.

Vascular/Lymphatic: No pathologically enlarged lymph nodes
identified. No evidence of abdominal aortic aneurysm.

Reproductive:  No mass or other significant abnormality.

Other:  None.

Musculoskeletal:  No suspicious bone lesions identified.
IMPRESSION: Bilateral nonobstructing nephrolithiasis. No evidence of ureteral
calculi, hydronephrosis, or other acute findings.

## 2018-04-25 ENCOUNTER — Telehealth: Payer: Self-pay | Admitting: Allergy

## 2018-04-25 MED ORDER — AUVI-Q 0.3 MG/0.3ML IJ SOAJ: INTRAMUSCULAR | 3 refills | Status: DC

## 2018-04-25 NOTE — Telephone Encounter (Signed)
Heather Barry needs a refill on her Auvi-Q?  Same pharmacy as before.

## 2018-04-25 NOTE — Telephone Encounter (Signed)
rx sent to [facility]

## 2019-06-30 ENCOUNTER — Encounter (INDEPENDENT_AMBULATORY_CARE_PROVIDER_SITE_OTHER): Payer: Self-pay

## 2019-06-30 ENCOUNTER — Encounter: Payer: Self-pay | Admitting: Allergy and Immunology

## 2019-06-30 ENCOUNTER — Other Ambulatory Visit: Payer: Self-pay

## 2019-06-30 ENCOUNTER — Ambulatory Visit (INDEPENDENT_AMBULATORY_CARE_PROVIDER_SITE_OTHER): Payer: Self-pay | Admitting: Allergy and Immunology

## 2019-06-30 VITALS — BP 110/56 | HR 84 | Temp 97.1°F | Resp 18 | Ht 66.3 in | Wt 137.6 lb

## 2019-06-30 DIAGNOSIS — Z91018 Allergy to other foods: Secondary | ICD-10-CM

## 2019-06-30 DIAGNOSIS — IMO0001 Reserved for inherently not codable concepts without codable children: Secondary | ICD-10-CM

## 2019-06-30 DIAGNOSIS — T63441D Toxic effect of venom of bees, accidental (unintentional), subsequent encounter: Secondary | ICD-10-CM

## 2019-06-30 DIAGNOSIS — J3089 Other allergic rhinitis: Secondary | ICD-10-CM

## 2019-06-30 MED ORDER — AUVI-Q 0.3 MG/0.3ML IJ SOAJ: INTRAMUSCULAR | 3 refills | Status: AC

## 2019-06-30 NOTE — Patient Instructions (Addendum)
  1. Allergen avoidance measures  2. Auvi-Q 0.3, Benadryl, M.D./ER evaluation for allergic reaction  3. Continue Zyrtec if needed  4. Obtain Covid Vaccine

## 2019-06-30 NOTE — Progress Notes (Signed)
[name]   Follow-up Note  Referring Provider: Myrlene Broker, MD Primary Provider: Myrlene Broker, MD Date of Office Visit: 06/30/2019  Subjective:   Heather Barry (DOB: 1961-04-13) is a 58 y.o. female who returns to the [facility] on 06/30/2019 in re-evaluation of the following:  HPI: Heather Barry returns to this clinic in reevaluation of hymenoptera venom hypersensitivity state and food allergy against shellfish and allergic rhinitis.  I have not seen her in this clinic since July 2018.  She was seen in this clinic by Dr. Nelva Bush on 12 July 2017.  She is very careful about hymenoptera venom exposure and fire venom exposure and has not had a reaction since her last visit in this clinic and has not had the need to use epinephrine.  She is not using immunotherapy at this point in time.  She remains away from shellfish consumption.  Occasionally she must take Zyrtec for her upper airway issue which at this point in time appears to be a minimal issue.  She did obtain the flu vaccine this year.  Allergies as of 06/30/2019      Reactions   Cefdinir Other (See Comments)   Stomach pain   Clarithromycin Other (See Comments)   Stomach pain   Zoloft  [sertraline Hcl] Itching      Medication List    Auvi-Q 0.3 mg/0.3 mL Soaj injection Generic drug: EPINEPHrine Use as directed for life-threatening allergic reaction.   cetirizine 10 MG tablet Commonly known as: ZYRTEC Take 10 mg by mouth daily.   famotidine 40 MG tablet Commonly known as: PEPCID Take 40 mg by mouth at bedtime.   PRESCRIPTION MEDICATION 2 (two) times daily. BioIdentical Hormone       Past Medical History:  Diagnosis Date  . Celiac disease   . Fibromyalgia   . Food allergy    Shellfish, banana, melons, kiwi, (also stays away from tree nuts)  . Hymenoptera allergy   . Low vitamin D level     Past Surgical History:  Procedure  Laterality Date  . CESAREAN SECTION  11/1996  . CHOLECYSTECTOMY  2007    Review of systems negative except as noted in HPI / PMHx or noted below:  Review of Systems  Constitutional: Negative.   HENT: Negative.   Eyes: Negative.   Respiratory: Negative.   Cardiovascular: Negative.   Gastrointestinal: Negative.   Genitourinary: Negative.   Musculoskeletal: Negative.   Skin: Negative.   Neurological: Negative.   Endo/Heme/Allergies: Negative.   Psychiatric/Behavioral: Negative.      Objective:   Vitals:   06/30/19 0844  BP: (!) 110/56  Pulse: 84  Resp: 18  Temp: (!) 97.1 F (36.2 C)  SpO2: 98%   Height: 5' 6.3" (168.4 cm)  Weight: 137 lb 9.6 oz (62.4 kg)   Physical Exam Constitutional:      Appearance: She is not diaphoretic.  HENT:     Head: Normocephalic.     Right Ear: Tympanic membrane, ear canal and external ear normal.     Left Ear: Tympanic membrane, ear canal and external ear normal.     Nose: Nose normal. No mucosal edema or rhinorrhea.     Mouth/Throat:     Pharynx: Uvula midline. No oropharyngeal exudate.  Eyes:     Conjunctiva/sclera: Conjunctivae normal.  Neck:     Thyroid: No thyromegaly.     Trachea: Trachea normal. No tracheal tenderness or tracheal deviation.  Cardiovascular:  Rate and Rhythm: Normal rate and regular rhythm.     Heart sounds: Normal heart sounds, S1 normal and S2 normal. No murmur.  Pulmonary:     Effort: No respiratory distress.     Breath sounds: Normal breath sounds. No stridor. No wheezing or rales.  Lymphadenopathy:     Head:     Right side of head: No tonsillar adenopathy.     Left side of head: No tonsillar adenopathy.     Cervical: No cervical adenopathy.  Skin:    Findings: No erythema or rash.     Nails: There is no clubbing.  Neurological:     Mental Status: She is alert.     Diagnostics:  none  Assessment and Plan:   1. Hymenoptera reaction, accidental or unintentional, subsequent encounter   2.  Other allergic rhinitis   3. Food allergy     1. Allergen avoidance measures  2. Auvi-Q 0.3, Benadryl, M.D./ER evaluation for allergic reaction  3. Continue Zyrtec if needed  4. Obtain Covid Vaccine   [name] appears to be doing very well and we will refill her Auvi-Q and she will continue to perform allergen avoidance measures directed against flying insect venom, fire ant venom, and shellfish.  I will see her back in this clinic in 1 year or earlier if there is a problem.  Allena Katz, MD [facility] [place]

## 2019-07-01 ENCOUNTER — Encounter: Payer: Self-pay | Admitting: Allergy and Immunology
